# Patient Record
Sex: Male | Born: 1985
Health system: Southern US, Community
[De-identification: ages and names within clinical notes are randomized; demographics above are authoritative.]

## PROBLEM LIST (undated history)

## (undated) DIAGNOSIS — J45909 Unspecified asthma, uncomplicated: Secondary | ICD-10-CM

## (undated) HISTORY — PX: NOSE SURGERY: SHX723

## (undated) HISTORY — PX: NASAL SINUS SURGERY: SHX719

---

## 2015-03-29 DIAGNOSIS — J45909 Unspecified asthma, uncomplicated: Secondary | ICD-10-CM | POA: Diagnosis not present

## 2015-03-29 DIAGNOSIS — J36 Peritonsillar abscess: Secondary | ICD-10-CM | POA: Diagnosis not present

## 2015-03-31 DIAGNOSIS — J351 Hypertrophy of tonsils: Secondary | ICD-10-CM | POA: Diagnosis not present

## 2015-03-31 DIAGNOSIS — J36 Peritonsillar abscess: Secondary | ICD-10-CM | POA: Diagnosis not present

## 2015-04-07 DIAGNOSIS — J343 Hypertrophy of nasal turbinates: Secondary | ICD-10-CM | POA: Diagnosis not present

## 2015-04-07 DIAGNOSIS — J342 Deviated nasal septum: Secondary | ICD-10-CM | POA: Diagnosis not present

## 2015-04-07 DIAGNOSIS — J36 Peritonsillar abscess: Secondary | ICD-10-CM | POA: Diagnosis not present

## 2015-04-07 DIAGNOSIS — J351 Hypertrophy of tonsils: Secondary | ICD-10-CM | POA: Diagnosis not present

## 2015-04-07 DIAGNOSIS — R0981 Nasal congestion: Secondary | ICD-10-CM | POA: Diagnosis not present

## 2016-01-18 ENCOUNTER — Encounter (HOSPITAL_COMMUNITY): Payer: Self-pay | Admitting: Emergency Medicine

## 2016-01-18 ENCOUNTER — Emergency Department (HOSPITAL_COMMUNITY)
Admission: EM | Admit: 2016-01-18 | Discharge: 2016-01-18 | Disposition: A | Discharge status: Home / Self Care | Payer: Self-pay | Attending: Dermatology | Admitting: Dermatology

## 2016-01-18 DIAGNOSIS — J45909 Unspecified asthma, uncomplicated: Secondary | ICD-10-CM | POA: Insufficient documentation

## 2016-01-18 DIAGNOSIS — Z5321 Procedure and treatment not carried out due to patient leaving prior to being seen by health care provider: Secondary | ICD-10-CM | POA: Insufficient documentation

## 2016-01-18 DIAGNOSIS — M549 Dorsalgia, unspecified: Secondary | ICD-10-CM | POA: Insufficient documentation

## 2016-01-18 DIAGNOSIS — F17200 Nicotine dependence, unspecified, uncomplicated: Secondary | ICD-10-CM | POA: Insufficient documentation

## 2016-01-18 HISTORY — DX: Unspecified asthma, uncomplicated: J45.909

## 2016-01-18 NOTE — ED Triage Notes (Signed)
Pt states "the pinkie toes of both feet hurt, I dont know if its gout or not" Pt no hx of gout. Pt has dead skin around both pinkie toes. No redness or swelling noted. Pt states the joints inside his toes hurts as well.

## 2016-01-18 NOTE — ED Triage Notes (Signed)
Pt states he has bulging disks in his back and wants to be seen for that as well.

## 2016-01-18 NOTE — ED Notes (Signed)
Pt stormed by stating he isn't waiting any more. RN advised pt against leaving without being seen by MD. Pt adamant about leaving.

## 2016-01-18 NOTE — ED Notes (Signed)
Patient comes up to nurse first and states he is leaving, This EMT already explained to patient he was next to go back, tried to stop patient and he put down his BP cuff and continued walking out the door. Will be taken out the system

## 2016-01-21 DIAGNOSIS — M79672 Pain in left foot: Secondary | ICD-10-CM | POA: Diagnosis not present

## 2016-01-21 DIAGNOSIS — M79671 Pain in right foot: Secondary | ICD-10-CM | POA: Diagnosis not present

## 2016-01-21 DIAGNOSIS — B353 Tinea pedis: Secondary | ICD-10-CM | POA: Diagnosis not present

## 2016-01-21 DIAGNOSIS — Z825 Family history of asthma and other chronic lower respiratory diseases: Secondary | ICD-10-CM | POA: Diagnosis not present

## 2016-01-21 DIAGNOSIS — J45909 Unspecified asthma, uncomplicated: Secondary | ICD-10-CM | POA: Diagnosis not present

## 2016-02-02 ENCOUNTER — Encounter (HOSPITAL_COMMUNITY): Payer: Self-pay | Admitting: Emergency Medicine

## 2016-02-02 ENCOUNTER — Emergency Department (HOSPITAL_COMMUNITY)
Admission: EM | Admit: 2016-02-02 | Discharge: 2016-02-02 | Disposition: A | Discharge status: Home / Self Care | Payer: Medicare Other | Attending: Emergency Medicine | Admitting: Emergency Medicine

## 2016-02-02 DIAGNOSIS — B09 Unspecified viral infection characterized by skin and mucous membrane lesions: Secondary | ICD-10-CM | POA: Diagnosis not present

## 2016-02-02 DIAGNOSIS — J45909 Unspecified asthma, uncomplicated: Secondary | ICD-10-CM | POA: Insufficient documentation

## 2016-02-02 DIAGNOSIS — R21 Rash and other nonspecific skin eruption: Secondary | ICD-10-CM | POA: Diagnosis not present

## 2016-02-02 DIAGNOSIS — F17200 Nicotine dependence, unspecified, uncomplicated: Secondary | ICD-10-CM | POA: Insufficient documentation

## 2016-02-02 MED ORDER — FAMOTIDINE 20 MG PO TABS: 20.0000 mg | ORAL_TABLET | Freq: Two times a day (BID) | ORAL | 0 refills | Status: DC

## 2016-02-02 MED ORDER — PREDNISONE 10 MG PO TABS: 20.0000 mg | ORAL_TABLET | Freq: Two times a day (BID) | ORAL | 0 refills | Status: DC

## 2016-02-02 MED ORDER — LORATADINE 10 MG PO TABS: 10.0000 mg | ORAL_TABLET | Freq: Every day | ORAL | 0 refills | Status: DC

## 2016-02-02 NOTE — ED Notes (Signed)
Declined W/C at D/C and was escorted to lobby by RN. 

## 2016-02-02 NOTE — ED Triage Notes (Signed)
C/o rash on abd and legs x 3 days.  Unknown cause.

## 2016-02-02 NOTE — Discharge Instructions (Signed)
Your rash could be allergic or viral. We are treating your itching. Stay cool and follow up with Dr. Margo AyeHall if symptoms persist. Take Benadryl at night if needed.

## 2016-02-02 NOTE — ED Provider Notes (Signed)
[facility]-EMERGENCY DEPT Provider Note   CSN: [id number] Arrival date & time: 02/02/16  1541  By signing my name below, I, Emmanuella Mensah, attest that this documentation has been prepared under the direction and in the presence of Kerrie BuffaloHope Neese, NP. Electronically Signed: Angelene GiovanniEmmanuella Mensah, ED Scribe. 02/02/16. 4:33 PM.   History   Chief Complaint Chief Complaint  Patient presents with  . Rash    HPI Comments: Logan Williams is a 30 y.o. male with a hx of asthma who presents to the Emergency Department complaining of gradually spreading moderately itching rash to his back, abdomen, bilateral upper arms, and bilateral anterior upper leg onset 3 days ago. He notes that the rash began on his back and the rash is worse with heat. He reports associated mild shortness of breath. He denies any known causes but states that he recently finished an oral medication for athlete's foot. He denies any new soaps, detergent, or lotion. No alleviating factors noted. He states that he has tried Benadryl with no relief. He has an allergy to bee venom and shellfish. He states that he works in the [employer]. He denies any fever, wheezing, lip/tongue swelling, sore throat, difficulty breathing, or any other symptoms.   The history is provided by the patient. No language interpreter was used.  Rash   This is a new problem. The current episode started more than 2 days ago. The problem has been gradually worsening. The problem is associated with new medications. There has been no fever. The rash is present on the abdomen, back, right arm, left arm, left upper leg and right upper leg. The pain is moderate. The pain has been constant since onset. Associated symptoms include itching. He has tried antihistamines for the symptoms. The treatment provided no relief. Risk factors include new medications.    Past Medical History:  Diagnosis Date  . Asthma     There are no active problems to display for this  patient.   Past Surgical History:  Procedure Laterality Date  . NOSE SURGERY         Home Medications    Prior to Admission medications   Medication Sig Start Date End Date Taking? Authorizing Provider  famotidine (PEPCID) 20 MG tablet Take 1 tablet (20 mg total) by mouth 2 (two) times daily. 02/02/16   Hope Orlene OchM Neese, NP  loratadine (CLARITIN) 10 MG tablet Take 1 tablet (10 mg total) by mouth daily. 02/02/16   Hope Orlene OchM Neese, NP  predniSONE (DELTASONE) 10 MG tablet Take 2 tablets (20 mg total) by mouth 2 (two) times daily with a meal. 02/02/16   Hope Orlene OchM Neese, NP    Family History No family history on file.  Social History Social History  Substance Use Topics  . Smoking status: Current Some Day Smoker  . Smokeless tobacco: Never Used  . Alcohol use Yes     Allergies   Bee venom and Shellfish allergy   Review of Systems Review of Systems  Constitutional: Negative for fever.  HENT: Negative for sore throat and trouble swallowing.   Respiratory: Negative for cough and wheezing.   Skin: Positive for itching and rash.     Physical Exam Updated Vital Signs BP 126/75 (BP Location: Right Arm)   Pulse 63   Temp 99.2 F (37.3 C) (Oral)   Resp 20   SpO2 100%   Physical Exam  Constitutional: He is oriented to person, place, and time. He appears well-developed and well-nourished. No distress.  HENT:  Head: Normocephalic and atraumatic.  Mouth/Throat: Uvula is midline and oropharynx is clear and moist. No posterior oropharyngeal edema or posterior oropharyngeal erythema.  Eyes: Conjunctivae and EOM are normal.  Neck: Neck supple. No tracheal deviation present.  Cardiovascular: Normal rate and regular rhythm.   Pulmonary/Chest: Effort normal and breath sounds normal. No respiratory distress. He has no wheezes.  Musculoskeletal: Normal range of motion.  Neurological: He is alert and oriented to person, place, and time.  Skin: Skin is warm and dry.  There is a dry rash to  the trunk and upper arms.   Psychiatric: He has a normal mood and affect. His behavior is normal.  Nursing note and vitals reviewed.    ED Treatments / Results  DIAGNOSTIC STUDIES: Oxygen Saturation is 100% on RA, normal by my interpretation.    COORDINATION OF CARE: 4:27 PM- Pt advised of plan for treatment and pt agrees. Pt will receive Claritin, Prednisone, and Pepcid. Advised to use Benadryl at night.    Labs (all labs ordered are listed, but only abnormal results are displayed) Labs Reviewed - No data to display  Radiology No results found.  Procedures Procedures (including critical care time)  Medications Ordered in ED Medications - No data to display   Initial Impression / Assessment and Plan / ED Course  Kerrie BuffaloHope Neese, NP has reviewed the triage vital signs and the nursing notes.   Clinical Course   Rash most likely viral, possible early pityriasis vs other viral rash.   Patient with viral exanthem. Discussed that this is self-limited.  No signs of secondary infection. Discharged with symptomatic treatment. Follow up with PCP in 2-3 days. Return precautions discussed. Pt is safe for discharge at this time.  Final Clinical Impressions(s) / ED Diagnoses   Final diagnoses:  Rash    New Prescriptions Discharge Medication List as of 02/02/2016  4:29 PM    START taking these medications   Details  famotidine (PEPCID) 20 MG tablet Take 1 tablet (20 mg total) by mouth 2 (two) times daily., Starting Wed 02/02/2016, Print    loratadine (CLARITIN) 10 MG tablet Take 1 tablet (10 mg total) by mouth daily., Starting Wed 02/02/2016, Print    predniSONE (DELTASONE) 10 MG tablet Take 2 tablets (20 mg total) by mouth 2 (two) times daily with a meal., Starting Wed 02/02/2016, Print       I personally performed the services described in this documentation, which was scribed in my presence. The recorded information has been reviewed and is accurate.    JonesvilleHope M Neese,  NP 02/03/16 2028    Lyndal Pulleyaniel Knott, MD 02/03/16 [phone]

## 2016-10-02 ENCOUNTER — Encounter (HOSPITAL_COMMUNITY): Payer: Self-pay | Admitting: Emergency Medicine

## 2016-10-02 ENCOUNTER — Emergency Department (HOSPITAL_COMMUNITY)
Admission: EM | Admit: 2016-10-02 | Discharge: 2016-10-02 | Disposition: A | Discharge status: Home / Self Care | Payer: Medicare Other | Attending: Emergency Medicine | Admitting: Emergency Medicine

## 2016-10-02 DIAGNOSIS — J45909 Unspecified asthma, uncomplicated: Secondary | ICD-10-CM | POA: Diagnosis not present

## 2016-10-02 DIAGNOSIS — F17200 Nicotine dependence, unspecified, uncomplicated: Secondary | ICD-10-CM | POA: Diagnosis not present

## 2016-10-02 DIAGNOSIS — H02844 Edema of left upper eyelid: Secondary | ICD-10-CM | POA: Insufficient documentation

## 2016-10-02 DIAGNOSIS — T7840XA Allergy, unspecified, initial encounter: Secondary | ICD-10-CM

## 2016-10-02 MED ORDER — PREDNISONE 20 MG PO TABS
60.0000 mg | ORAL_TABLET | Freq: Once | ORAL | Status: AC
  Administered 2016-10-02: 60 mg via ORAL
  Filled 2016-10-02: qty 3

## 2016-10-02 MED ORDER — POLYMYXIN B-TRIMETHOPRIM 10000-0.1 UNIT/ML-% OP SOLN
1.0000 [drp] | Freq: Once | OPHTHALMIC | Status: AC
  Administered 2016-10-02: 1 [drp] via OPHTHALMIC
  Filled 2016-10-02: qty 10

## 2016-10-02 NOTE — ED Triage Notes (Signed)
Pt reports his left eye began to swell this morning.  He reports his asthma also flared up however his inhaler which corrected the problem.  Also reports that he has some swelling in his gums.

## 2016-10-02 NOTE — Discharge Instructions (Signed)
Do not hesitate to return to the emergency room for any new, worsening or concerning symptoms. ° °Please obtain primary care using resource guide below. Let them know that you were seen in the emergency room and that they will need to obtain records for further outpatient management. ° ° °

## 2016-10-02 NOTE — ED Provider Notes (Signed)
[facility]PT Provider Note   CSN: [id number] Arrival date & time: 10/02/16  2048     History   Chief Complaint Chief Complaint  Patient presents with  . Facial Swelling  . Oral Swelling     HPI   Blood pressure 125/71, pulse 69, temperature 98.7 F (37.1 C), temperature source Oral, resp. rate 17, SpO2 99 %.  Logan Williams is a 31 y.o. male complaining of left eye swelling onset this morning from waking. There was no trauma, no change in vision, there is clear discharge from the eye. He does work [employer]. He states that this happened once before. Patient is an asthmatic, he was feeling short of breath this afternoon took his inhaler and feels much better. He states that sometimes he has swollen gums any wonders if it's related to eye swelling. [place] and does not have any primary care. He denies fever, pain, redness, pain with eye movement.  Past Medical History:  Diagnosis Date  . Asthma     There are no active problems to display for this patient.   Past Surgical History:  Procedure Laterality Date  . NOSE SURGERY         Home Medications    Prior to Admission medications   Medication Sig Start Date End Date Taking? Authorizing Provider  famotidine (PEPCID) 20 MG tablet Take 1 tablet (20 mg total) by mouth 2 (two) times daily. 02/02/16   Janne NapoleonNeese, Hope M, NP  loratadine (CLARITIN) 10 MG tablet Take 1 tablet (10 mg total) by mouth daily. 02/02/16   Janne NapoleonNeese, Hope M, NP  predniSONE (DELTASONE) 10 MG tablet Take 2 tablets (20 mg total) by mouth 2 (two) times daily with a meal. 02/02/16   Janne NapoleonNeese, Hope M, NP    Family History No family history on file.  Social History Social History  Substance Use Topics  . Smoking status: Current Some Day Smoker  . Smokeless tobacco: Never Used  . Alcohol use Yes     Allergies   Bee venom and Shellfish allergy   Review of Systems Review of Systems  A complete review of systems was  obtained and all systems are negative except as noted in the HPI and PMH.   Physical Exam Updated Vital Signs BP 125/71 (BP Location: Left Arm)   Pulse 69   Temp 98.7 F (37.1 C) (Oral)   Resp 17   SpO2 99%   Physical Exam  Constitutional: He is oriented to person, place, and time. He appears well-developed and well-nourished. No distress.  HENT:  Head: Normocephalic and atraumatic.  Mouth/Throat: Oropharynx is clear and moist.  Edematous left upper eyelid with scant clear discharge. No conjunctival injection, no warmth. Extraocular movement is intact without pain or diplopia.  Eyes: Pupils are equal, round, and reactive to light. Conjunctivae and EOM are normal.  Neck: Normal range of motion.  Cardiovascular: Normal rate, regular rhythm and intact distal pulses.   Pulmonary/Chest: Effort normal and breath sounds normal.  Abdominal: Soft. There is no tenderness.  Musculoskeletal: Normal range of motion.  Neurological: He is alert and oriented to person, place, and time.  Skin: He is not diaphoretic.  Psychiatric: He has a normal mood and affect.  Nursing note and vitals reviewed.    ED Treatments / Results  Labs (all labs ordered are listed, but only abnormal results are displayed) Labs Reviewed - No data to display  EKG  EKG Interpretation None  Radiology No results found.  Procedures Procedures (including critical care time)  Medications Ordered in ED Medications  trimethoprim-polymyxin b (POLYTRIM) ophthalmic solution 1 drop (not administered)  predniSONE (DELTASONE) tablet 60 mg (not administered)     Initial Impression / Assessment and Plan / ED Course  I have reviewed the triage vital signs and the nursing notes.  Pertinent labs & imaging results that were available during my care of the patient were reviewed by me and considered in my medical decision making (see chart for details).     Vitals:   10/02/16 2101 10/02/16 2232  BP: 111/66  125/71  Pulse: 70 69  Resp: 18 17  Temp: 98.7 F (37.1 C)   TempSrc: Oral   SpO2: 96% 99%    Medications  trimethoprim-polymyxin b (POLYTRIM) ophthalmic solution 1 drop (not administered)  predniSONE (DELTASONE) tablet 60 mg (not administered)    Logan Williams is 31 y.o. male presenting with Left upper eyelid swelling onset this afternoon. There was no trauma to the eye. Vision is not affected. He is not a contact lens wearer. There is no warmth to suggest infection, no pain with eye movement. This is likely an allergic reaction. I would like to treat him with a burst of prednisone. Patient given ophthalmologic antibiotic drops at his request.   Evaluation does not show pathology that would require ongoing emergent intervention or inpatient treatment. Pt is hemodynamically stable and mentating appropriately. Discussed findings and plan with patient/guardian, who agrees with care plan. All questions answered. Return precautions discussed and outpatient follow up given.      Final Clinical Impressions(s) / ED Diagnoses   Final diagnoses:  Allergic reaction, initial encounter    New Prescriptions New Prescriptions   No medications on file     Kaylyn Lim 10/02/16 2253    Tegeler, Canary Brim, MD 10/03/16 1022

## 2016-10-02 NOTE — ED Notes (Signed)
Patient A/O. No distress at this time.  Please see physicians notes.

## 2017-04-14 ENCOUNTER — Encounter (HOSPITAL_COMMUNITY): Payer: Self-pay | Admitting: Emergency Medicine

## 2017-04-14 ENCOUNTER — Emergency Department (HOSPITAL_COMMUNITY)
Admission: EM | Admit: 2017-04-14 | Discharge: 2017-04-14 | Disposition: A | Discharge status: Home / Self Care | Payer: Medicare Other | Attending: Emergency Medicine | Admitting: Emergency Medicine

## 2017-04-14 ENCOUNTER — Emergency Department (HOSPITAL_COMMUNITY): Payer: Medicare Other

## 2017-04-14 DIAGNOSIS — J45909 Unspecified asthma, uncomplicated: Secondary | ICD-10-CM | POA: Insufficient documentation

## 2017-04-14 DIAGNOSIS — R0789 Other chest pain: Secondary | ICD-10-CM | POA: Diagnosis not present

## 2017-04-14 DIAGNOSIS — F17200 Nicotine dependence, unspecified, uncomplicated: Secondary | ICD-10-CM | POA: Insufficient documentation

## 2017-04-14 DIAGNOSIS — R0602 Shortness of breath: Secondary | ICD-10-CM | POA: Diagnosis not present

## 2017-04-14 DIAGNOSIS — Z79899 Other long term (current) drug therapy: Secondary | ICD-10-CM | POA: Diagnosis not present

## 2017-04-14 DIAGNOSIS — J3489 Other specified disorders of nose and nasal sinuses: Secondary | ICD-10-CM | POA: Insufficient documentation

## 2017-04-14 DIAGNOSIS — Z91013 Allergy to seafood: Secondary | ICD-10-CM | POA: Diagnosis not present

## 2017-04-14 DIAGNOSIS — J111 Influenza due to unidentified influenza virus with other respiratory manifestations: Secondary | ICD-10-CM | POA: Diagnosis not present

## 2017-04-14 DIAGNOSIS — R05 Cough: Secondary | ICD-10-CM

## 2017-04-14 DIAGNOSIS — Z91030 Bee allergy status: Secondary | ICD-10-CM | POA: Insufficient documentation

## 2017-04-14 DIAGNOSIS — R0982 Postnasal drip: Secondary | ICD-10-CM | POA: Diagnosis not present

## 2017-04-14 DIAGNOSIS — R0989 Other specified symptoms and signs involving the circulatory and respiratory systems: Secondary | ICD-10-CM | POA: Insufficient documentation

## 2017-04-14 DIAGNOSIS — J029 Acute pharyngitis, unspecified: Secondary | ICD-10-CM | POA: Diagnosis not present

## 2017-04-14 DIAGNOSIS — R6883 Chills (without fever): Secondary | ICD-10-CM | POA: Insufficient documentation

## 2017-04-14 DIAGNOSIS — R059 Cough, unspecified: Secondary | ICD-10-CM

## 2017-04-14 LAB — CBC
HCT: 41.6 % (ref 39.0–52.0)
HEMOGLOBIN: 14.4 g/dL (ref 13.0–17.0)
MCH: 29.6 pg (ref 26.0–34.0)
MCHC: 34.6 g/dL (ref 30.0–36.0)
MCV: 85.4 fL (ref 78.0–100.0)
Platelets: 220 10*3/uL (ref 150–400)
RBC: 4.87 MIL/uL (ref 4.22–5.81)
RDW: 13.1 % (ref 11.5–15.5)
WBC: 7.5 10*3/uL (ref 4.0–10.5)

## 2017-04-14 LAB — BASIC METABOLIC PANEL
ANION GAP: 12 (ref 5–15)
BUN: 5 mg/dL — ABNORMAL LOW (ref 6–20)
CALCIUM: 9.1 mg/dL (ref 8.9–10.3)
CO2: 22 mmol/L (ref 22–32)
Chloride: 104 mmol/L (ref 101–111)
Creatinine, Ser: 1.07 mg/dL (ref 0.61–1.24)
GFR calc Af Amer: >60 mL/min (ref >60)
GFR calc non Af Amer: >60 mL/min (ref >60)
GLUCOSE: 101 mg/dL — AB (ref 65–99)
POTASSIUM: 3.4 mmol/L — AB (ref 3.5–5.1)
Sodium: 138 mmol/L (ref 135–145)

## 2017-04-14 LAB — TROPONIN I: Troponin I: <0.03 ng/mL (ref <0.03)

## 2017-04-14 LAB — INFLUENZA PANEL BY PCR (TYPE A & B)
INFLAPCR: POSITIVE — AB
Influenza B By PCR: NEGATIVE

## 2017-04-14 MED ORDER — HYDROCOD POLST-CPM POLST ER 10-8 MG/5ML PO SUER
5.0000 mL | Freq: Once | ORAL | Status: AC
  Administered 2017-04-14: 5 mL via ORAL
  Filled 2017-04-14: qty 5

## 2017-04-14 MED ORDER — ALBUTEROL SULFATE HFA 108 (90 BASE) MCG/ACT IN AERS: 1.0000 | INHALATION_SPRAY | Freq: Four times a day (QID) | RESPIRATORY_TRACT | 0 refills | Status: DC | PRN

## 2017-04-14 MED ORDER — IPRATROPIUM-ALBUTEROL 0.5-2.5 (3) MG/3ML IN SOLN
RESPIRATORY_TRACT | Status: AC
  Filled 2017-04-14: qty 3

## 2017-04-14 MED ORDER — IBUPROFEN 400 MG PO TABS
600.0000 mg | ORAL_TABLET | Freq: Once | ORAL | Status: AC
  Administered 2017-04-14: 600 mg via ORAL
  Filled 2017-04-14: qty 1

## 2017-04-14 MED ORDER — IPRATROPIUM-ALBUTEROL 0.5-2.5 (3) MG/3ML IN SOLN
3.0000 mL | Freq: Once | RESPIRATORY_TRACT | Status: AC
  Administered 2017-04-14: 3 mL via RESPIRATORY_TRACT
  Filled 2017-04-14: qty 3

## 2017-04-14 MED ORDER — ACETAMINOPHEN 325 MG PO TABS
650.0000 mg | ORAL_TABLET | Freq: Once | ORAL | Status: AC
  Administered 2017-04-14: 650 mg via ORAL

## 2017-04-14 MED ORDER — BENZONATATE 100 MG PO CAPS: 100.0000 mg | ORAL_CAPSULE | Freq: Three times a day (TID) | ORAL | 0 refills | Status: DC

## 2017-04-14 NOTE — Discharge Instructions (Signed)
Your workup has been reassuring.  Your flu positive.  This is likely causing her symptoms.  Make sure that you are continuing Motrin and Tylenol around-the-clock to help with your fevers.  Take the Tessalon for cough.  Use the albuterol inhaler as needed for wheezing or shortness of breath.  I would also recommend that you take over-the-counter decongestion such as Mucinex.  She is drinking plenty of fluids.  Good hand hygiene.  Follow-up with primary care doctor if not improved return to the ED with any worsening symptoms.

## 2017-04-14 NOTE — ED Provider Notes (Signed)
[facility] Provider Note   CSN: [id number] Arrival date & time: 04/14/17  [id number]     History   Chief Complaint Chief Complaint  Patient presents with  . Chest Pain    HPI Logan Williams is a 32 y.o. male.  HPI 32 year old African-American male past medical history significant for asthma presents to the emergency department today with complaints of cough, chest pain, shortness of breath, sore throat and chest congestion.  Patient states that his symptoms started approximately 2 days ago.  He reports chest pain with cough and a nonproductive cough.  He also reports having a sore throat yesterday with associated chest congestion.  Denies any associated nasal congestion.  The patient does not take any over-the-counter medications for his symptoms.  Nothing makes symptoms better or worse.  Patient is tolerating p.o. fluids appropriately.  He does report intermittent chills but denies any known fevers at home.  Patient has been using his albuterol inhaler without any relief.  Denies any associated wheezing.  Reports occasional tobacco use.  Denies any history of DVT/PE, prolonged immobilization, recent hospitalizations or surgery, unilateral leg swelling or calf tenderness, hemoptysis.  Denies any IV drug use.  Pt denies any fever, chill, ha, vision changes, lightheadedness, dizziness, congestion, neck pain, abd pain, n/v/d, urinary symptoms, change in bowel habits, melena, hematochezia, lower extremity paresthesias.  Past Medical History:  Diagnosis Date  . Asthma     There are no active problems to display for this patient.   Past Surgical History:  Procedure Laterality Date  . NOSE SURGERY         Home Medications    Prior to Admission medications   Medication Sig Start Date End Date Taking? Authorizing Provider  famotidine (PEPCID) 20 MG tablet Take 1 tablet (20 mg total) by mouth 2 (two) times daily. 02/02/16   Janne NapoleonNeese, Hope M, NP    loratadine (CLARITIN) 10 MG tablet Take 1 tablet (10 mg total) by mouth daily. 02/02/16   Janne NapoleonNeese, Hope M, NP  predniSONE (DELTASONE) 10 MG tablet Take 2 tablets (20 mg total) by mouth 2 (two) times daily with a meal. 02/02/16   Janne NapoleonNeese, Hope M, NP    Family History History reviewed. No pertinent family history.  Social History Social History   Tobacco Use  . Smoking status: Current Some Day Smoker  . Smokeless tobacco: Never Used  Substance Use Topics  . Alcohol use: Yes  . Drug use: Yes    Types: Marijuana     Allergies   Bee venom and Shellfish allergy   Review of Systems Review of Systems  Constitutional: Positive for chills. Negative for diaphoresis and fever.  HENT: Positive for congestion, postnasal drip and sore throat.   Eyes: Negative for visual disturbance.  Respiratory: Positive for cough and shortness of breath. Negative for wheezing.   Cardiovascular: Positive for chest pain. Negative for palpitations and leg swelling.  Gastrointestinal: Negative for abdominal pain, diarrhea, nausea and vomiting.  Genitourinary: Negative for dysuria, flank pain, frequency, hematuria, scrotal swelling, testicular pain and urgency.  Musculoskeletal: Negative for arthralgias and myalgias.  Skin: Negative for rash.  Neurological: Negative for dizziness, syncope, weakness, light-headedness, numbness and headaches.  Psychiatric/Behavioral: Negative for sleep disturbance. The patient is not nervous/anxious.      Physical Exam Updated Vital Signs BP 119/80 (BP Location: Right Arm)   Pulse 73   Temp 100.3 F (37.9 C) (Oral)   Resp 16   Ht 5\' 4"  (1.626 m)  Wt 72.6 kg (160 lb)   SpO2 100%   BMI 27.46 kg/m   Physical Exam  Constitutional: He is oriented to person, place, and time. He appears well-developed and well-nourished.  Non-toxic appearance. No distress.  HENT:  Head: Normocephalic and atraumatic.  Right Ear: Tympanic membrane, external ear and ear canal normal.  Left  Ear: Tympanic membrane, external ear and ear canal normal.  Nose: Mucosal edema and rhinorrhea present.  Mouth/Throat: Uvula is midline, oropharynx is clear and moist and mucous membranes are normal.  Eyes: Conjunctivae are normal. Pupils are equal, round, and reactive to light. Right eye exhibits no discharge. Left eye exhibits no discharge.  Neck: Normal range of motion. Neck supple. No JVD present. No tracheal deviation present.  Cardiovascular: Normal rate, regular rhythm, normal heart sounds and intact distal pulses. Exam reveals no gallop and no friction rub.  No murmur heard. Pulmonary/Chest: Effort normal and breath sounds normal. No stridor. No respiratory distress. He has no wheezes. He has no rales. He exhibits tenderness.  No hypoxia or tachypnea.    Abdominal: Soft. Bowel sounds are normal. He exhibits no distension. There is no tenderness. There is no rebound and no guarding.  Musculoskeletal: Normal range of motion. He exhibits no tenderness.  No lower extremity edema or calf tenderness.  Lymphadenopathy:    He has no cervical adenopathy.  Neurological: He is alert and oriented to person, place, and time.  Skin: Skin is warm and dry. Capillary refill takes less than 2 seconds. No rash noted. He is not diaphoretic.  Psychiatric: His behavior is normal. Judgment and thought content normal.  Nursing note and vitals reviewed.    ED Treatments / Results  Labs (all labs ordered are listed, but only abnormal results are displayed) Labs Reviewed  BASIC METABOLIC PANEL - Abnormal; Notable for the following components:      Result Value   Potassium 3.4 (*)    Glucose, Bld 101 (*)    BUN 5 (*)    All other components within normal limits  CBC  TROPONIN I  INFLUENZA PANEL BY PCR (TYPE A & B)    EKG  EKG Interpretation  Date/Time:  Saturday April 14 2017 06:57:06 EST Ventricular Rate:  92 PR Interval:  126 QRS Duration: 84 QT Interval:  336 QTC Calculation: 415 R  Axis:   76 Text Interpretation:  Sinus rhythm T wave abnormality Artifact Abnormal ekg Confirmed by Gerhard Munch [phone]) on 04/14/2017 7:05:43 AM       Radiology Dg Chest 2 View  Result Date: 04/14/2017 CLINICAL DATA:  Shortness of breath and cough for 2 days EXAM: CHEST  2 VIEW COMPARISON:  None. FINDINGS: The heart size and mediastinal contours are within normal limits. Both lungs are clear. The visualized skeletal structures are unremarkable. IMPRESSION: No active cardiopulmonary disease. Electronically Signed   By: Alcide Clever M.D.   On: 04/14/2017 07:21    Procedures Procedures (including critical care time)  Medications Ordered in ED Medications  acetaminophen (TYLENOL) tablet 650 mg (650 mg Oral Given 04/14/17 0657)  ipratropium-albuterol (DUONEB) 0.5-2.5 (3) MG/3ML nebulizer solution 3 mL (3 mLs Nebulization Given 04/14/17 0834)  ibuprofen (ADVIL,MOTRIN) tablet 600 mg (600 mg Oral Given 04/14/17 0834)  chlorpheniramine-HYDROcodone (TUSSIONEX) 10-8 MG/5ML suspension 5 mL (5 mLs Oral Given 04/14/17 9604)     Initial Impression / Assessment and Plan / ED Course  I have reviewed the triage vital signs and the nursing notes.  Pertinent labs & imaging results that were  available during my care of the patient were reviewed by me and considered in my medical decision making (see chart for details).    Patient presents the ED with complaints of chest pain, shortness of breath, influenza-like symptoms.  Started 2 days ago.  Patient reports associated fevers and chills. Perc negative.   Patient is overall well-appearing and nontoxic.  He is febrile in the ED which has resolved with antipyretics.  Patient is not hypotensive or tachycardic.  No hypoxia or tachypnea is noted.  On exam lungs clear to auscultation bilaterally.  Heart regular rate and rhythm.  No focal abdominal tenderness to palpation.  No lower extremity edema or calf tenderness.   Lab work reveals no leukocytosis.   Electro lites appear reassuring with normal kidney function.  Negative delta troponin.  He is positive for influenza A.  EKG does show some nonspecific T wave abnormalities.  Repeat EKG shows similar changes with no history of prior.  Chest x-ray is unremarkable for any focal infiltrate.  Patient presentation seems atypical for ACS.  Chest pain is worse with cough.  Cough secondary to influenza infection.  No signs of pneumonia on chest x-ray.  Clinical presentation does not seem consistent with PE, dissection, pericarditis, or myocarditis.   DVitals are stable, low-grade fever.  No signs of dehydration, tolerating PO's. Discussed the cost versus benefit of Tamiflu treatment with the patient.  The patient understands that symptoms are greater than the recommended 24-48 hour window of treatment.  Patient will be discharged with instructions to orally hydrate, rest, and use over-the-counter medications such as anti-inflammatories ibuprofen and Aleve for muscle aches and Tylenol for fever.  Patient will also be given a cough suppressant.   Pt is hemodynamically stable, in NAD, & able to ambulate in the ED. Evaluation does not show pathology that would require ongoing emergent intervention or inpatient treatment. I explained the diagnosis to the patient. Pain has been managed & has no complaints prior to dc. Pt is comfortable with above plan and is stable for discharge at this time. All questions were answered prior to disposition. Strict return precautions for f/u to the ED were discussed. Encouraged follow up with PCP.    Final Clinical Impressions(s) / ED Diagnoses   Final diagnoses:  Influenza  Atypical chest pain  Cough    ED Discharge Orders        Ordered    benzonatate (TESSALON) 100 MG capsule  Every 8 hours     04/14/17 1139    albuterol (PROVENTIL HFA;VENTOLIN HFA) 108 (90 Base) MCG/ACT inhaler  Every 6 hours PRN     04/14/17 1139       Wallace Keller 04/14/17 1536      Gerhard Munch, MD 04/15/17 [phone]

## 2017-04-14 NOTE — ED Triage Notes (Signed)
Pt reports shortness of breath, cough and chest pain x2 days. Dry cough

## 2017-04-25 DIAGNOSIS — F315 Bipolar disorder, current episode depressed, severe, with psychotic features: Secondary | ICD-10-CM | POA: Diagnosis not present

## 2017-04-25 DIAGNOSIS — F4310 Post-traumatic stress disorder, unspecified: Secondary | ICD-10-CM | POA: Diagnosis not present

## 2017-05-16 DIAGNOSIS — F315 Bipolar disorder, current episode depressed, severe, with psychotic features: Secondary | ICD-10-CM | POA: Diagnosis not present

## 2017-05-16 DIAGNOSIS — F4310 Post-traumatic stress disorder, unspecified: Secondary | ICD-10-CM | POA: Diagnosis not present

## 2017-05-21 ENCOUNTER — Encounter (HOSPITAL_COMMUNITY): Payer: Self-pay | Admitting: Emergency Medicine

## 2017-05-21 ENCOUNTER — Other Ambulatory Visit: Payer: Self-pay

## 2017-05-21 ENCOUNTER — Ambulatory Visit (HOSPITAL_COMMUNITY)
Admission: EM | Admit: 2017-05-21 | Discharge: 2017-05-21 | Disposition: A | Discharge status: Home / Self Care | Payer: Medicare Other | Attending: Urgent Care | Admitting: Urgent Care

## 2017-05-21 DIAGNOSIS — R0789 Other chest pain: Secondary | ICD-10-CM

## 2017-05-21 DIAGNOSIS — J3489 Other specified disorders of nose and nasal sinuses: Secondary | ICD-10-CM

## 2017-05-21 DIAGNOSIS — R059 Cough, unspecified: Secondary | ICD-10-CM

## 2017-05-21 DIAGNOSIS — R05 Cough: Secondary | ICD-10-CM

## 2017-05-21 DIAGNOSIS — L500 Allergic urticaria: Secondary | ICD-10-CM

## 2017-05-21 DIAGNOSIS — J0110 Acute frontal sinusitis, unspecified: Secondary | ICD-10-CM

## 2017-05-21 MED ORDER — PREDNISONE 20 MG PO TABS: ORAL_TABLET | ORAL | 0 refills | Status: DC

## 2017-05-21 MED ORDER — AMOXICILLIN 500 MG PO CAPS: 500.0000 mg | ORAL_CAPSULE | Freq: Three times a day (TID) | ORAL | 0 refills | Status: DC

## 2017-05-21 NOTE — Discharge Instructions (Signed)
Take Zyrtec 10mg  once daily with Zantac 150mg  twice daily.

## 2017-05-21 NOTE — ED Triage Notes (Signed)
Cough started yesterday, coughing up brown and green phlegm, headache.  Complains of a rash to arms and torso that started last week.  Patient thinks this is a reaction to psych meds

## 2017-05-21 NOTE — ED Provider Notes (Signed)
  MRN: [id number] DOB: April 18, 1985  Subjective:   Logan Williams is a 32 y.o. male presenting for several day history of malaise, frontal headache, stuffy nose, productive cough that elicits chest pain. Also had an allergic reaction to Depakote, had an urticarial rash that is persisting today. Denies fever, n/v, abdominal pain, chest tightness, facial swelling, edema, throat closing. Was diagnosed and treated for the flu 04/14/2017.   Logan Williams is allergic to bee venom and shellfish allergy.  Logan Williams  has a past medical history of Asthma. Also  has a past surgical history that includes Nose surgery and Nasal sinus surgery. His family history includes Hypertension in his mother.   Objective:   Vitals: BP 137/82 (BP Location: Left Arm)   Pulse 73   Temp 98.5 F (36.9 C) (Oral)   Resp 18   SpO2 100%   Physical Exam  Constitutional: He is oriented to person, place, and time. He appears well-developed and well-nourished.  HENT:  TM's intact bilaterally, no effusions or erythema. Nasal turbinates dry, erythematous, nasal passages patent. Frontal sinus tenderness. Oropharynx with mild post-nasal drainage, mucous membranes moist.  Eyes: Right eye exhibits no discharge. Left eye exhibits no discharge.  Neck: Normal range of motion. Neck supple.  Cardiovascular: Normal rate, regular rhythm and intact distal pulses. Exam reveals no gallop and no friction rub.  No murmur heard. Pulmonary/Chest: No respiratory distress. He has no wheezes. He has no rales.  Abdominal: Soft. Bowel sounds are normal. He exhibits no distension and no mass. There is no tenderness. There is no guarding.  Lymphadenopathy:    He has no cervical adenopathy.  Neurological: He is alert and oriented to person, place, and time.  Skin: Skin is warm and dry. Rash (patches of urticaria over torso, back, arms bilaterally) noted.   Assessment and Plan :   Allergic urticaria  Cough  Atypical chest pain  Frontal sinus pain  Acute  non-recurrent frontal sinusitis  Will start prednisone for rash. Use antihistamines as well. Start amoxicillin for sinusitis. Supportive care recommended otherwise. Return-to-clinic precautions discussed, patient verbalized understanding.    Wallis BambergMani, Jadie Allington, [place]C 05/21/17 1850

## 2017-06-01 DIAGNOSIS — F315 Bipolar disorder, current episode depressed, severe, with psychotic features: Secondary | ICD-10-CM | POA: Diagnosis not present

## 2017-06-01 DIAGNOSIS — F4310 Post-traumatic stress disorder, unspecified: Secondary | ICD-10-CM | POA: Diagnosis not present

## 2017-06-16 ENCOUNTER — Emergency Department (HOSPITAL_COMMUNITY): Payer: Medicare Other

## 2017-06-16 ENCOUNTER — Emergency Department (HOSPITAL_COMMUNITY)
Admission: EM | Admit: 2017-06-16 | Discharge: 2017-06-16 | Disposition: A | Discharge status: Home / Self Care | Payer: Medicare Other | Attending: Emergency Medicine | Admitting: Emergency Medicine

## 2017-06-16 ENCOUNTER — Encounter (HOSPITAL_COMMUNITY): Payer: Self-pay

## 2017-06-16 DIAGNOSIS — M25512 Pain in left shoulder: Secondary | ICD-10-CM | POA: Diagnosis not present

## 2017-06-16 DIAGNOSIS — F17200 Nicotine dependence, unspecified, uncomplicated: Secondary | ICD-10-CM | POA: Insufficient documentation

## 2017-06-16 DIAGNOSIS — Z23 Encounter for immunization: Secondary | ICD-10-CM | POA: Diagnosis not present

## 2017-06-16 DIAGNOSIS — J45909 Unspecified asthma, uncomplicated: Secondary | ICD-10-CM | POA: Diagnosis not present

## 2017-06-16 MED ORDER — TETANUS-DIPHTH-ACELL PERTUSSIS 5-2.5-18.5 LF-MCG/0.5 IM SUSP
0.5000 mL | Freq: Once | INTRAMUSCULAR | Status: AC
  Administered 2017-06-16: 0.5 mL via INTRAMUSCULAR
  Filled 2017-06-16: qty 0.5
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
## 2017-06-16 NOTE — ED Provider Notes (Signed)
.    No other injury.  Complains of left shoulder pain since he forcibly lifted a trash can 8 days ago.  Complains of numbness in index finger and middle finger of left hand since lifting trash can.  No other injury. On exam no distress left upper extremity with full range motion of shoulder and hand.  All fingers with good capillary refill.  Motor strength 5/5 of hand shoulder elbow and wrist.  I did advise patient is a loose body in his left shoulder, and need for orthopedic follow-up   Doug SouJacubowitz, Joey Hudock, MD 06/16/17 1530

## 2017-06-16 NOTE — ED Provider Notes (Signed)
[facility] EMERGENCY DEPARTMENT Provider Note   CSN: [id number] Arrival date & time: 06/16/17  1027     History   Chief Complaint No chief complaint on file.   HPI Logan Williams is a 32 y.o. 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  He states he has pain diffusely over left shoulder with radiation down left arm and tingling sensation.  He denies previous injury to the shoulder.  Has been taking ibuprofen and aspirin for symptoms.  Pain is worse with movement, mostly lifting arm. Does report a dog bite that which he received prior to the injury to the left hand, which is healed.  He states he is unsure if his tetanus was done within 5 years.  States he treated his wound with wound care and topical antibiotic ointment, no other associated complaints.  The history is provided by the patient.    Past Medical History:  Diagnosis Date  . Asthma     There are no active problems to display for this patient.   Past Surgical History:  Procedure Laterality Date  . NASAL SINUS SURGERY    . NOSE SURGERY          Home Medications    Prior to Admission medications   Medication Sig Start Date End Date Taking? Authorizing Provider  albuterol (PROVENTIL HFA;VENTOLIN HFA) 108 (90 Base) MCG/ACT inhaler Inhale 1-2 puffs into the lungs every 6 (six) hours as needed for wheezing or shortness of breath. 04/14/17   Leaphart, Lynann Beaver, PA-C  amoxicillin (AMOXIL) 500 MG capsule Take 1 capsule (500 mg total) by mouth 3 (three) times daily. 05/21/17   Wallis Bamberg, PA-C  predniSONE (DELTASONE) 20 MG tablet Take 2 tablets daily with breakfast. 05/21/17   Wallis Bamberg, PA-C    Family History Family History  Problem Relation Age of Onset  . Hypertension Mother     Social History Social History   Tobacco Use  . Smoking status: Current Some Day  Smoker  . Smokeless tobacco: Never Used  Substance Use Topics  . Alcohol use: Yes  . Drug use: Yes    Types: Marijuana     Allergies   Bee venom and Shellfish allergy   Review of Systems Review of Systems  Constitutional: Negative for fever.  Musculoskeletal: Positive for arthralgias and myalgias. Negative for joint swelling.  Skin: Positive for wound. Negative for color change.     Physical Exam Updated Vital Signs BP (!) 147/84 (BP Location: Right Arm)   Pulse 73   Temp 98 F (36.7 C) (Oral)   Resp 20   SpO2 98%   Physical Exam  Constitutional: He appears well-developed and well-nourished. No distress.  HENT:  Head: Normocephalic and atraumatic.  Eyes: Conjunctivae are normal.  Cardiovascular: Normal rate and intact distal pulses.  Pulmonary/Chest: Effort normal.  Musculoskeletal:  Left shoulder with generalized tenderness surrounding joint, no obvious deformities, edema, erythema or warmth.  Pain with passive abduction.  Pain with resistive internal rotation.  Normal sensation bilaterally.  5/5 grip strength bilaterally.  Skin:  Dorsum of left hand with healed 2 cm scab.  Wound is completely sealed, no tenderness, erythema or drainage. nontender.  Psychiatric: He has a normal mood and affect. His behavior is normal.  Nursing note and vitals reviewed.    ED Treatments / Results  Labs (all  labs ordered are listed, but only abnormal results are displayed) Labs Reviewed - No data to display  EKG None  Radiology Dg Shoulder Left  Result Date: 06/16/2017 CLINICAL DATA:  Patient complains of left shoulder pain with radiation to forearm with any ROM x 1 week. Denies trauma EXAM: LEFT SHOULDER - 2+ VIEW COMPARISON:  None. FINDINGS: No acute fracture or dislocation. Joint spaces maintained. Visualized portion of the left hemithorax is normal. Suspect a 9 mm glenohumeral joint intra-articular loose body projecting inferior to the proximal humerus. IMPRESSION: No acute  osseous abnormality. Suspected intra-articular glenohumeral joint loose body. Electronically Signed   By: Jeronimo GreavesKyle  Talbot M.D.   On: 06/16/2017 14:34    Procedures Procedures (including critical care time)  Medications Ordered in ED Medications  Tdap (BOOSTRIX) injection 0.5 mL (0.5 mLs Intramuscular Given 06/16/17 1429)     Initial Impression / Assessment and Plan / ED Course  I have reviewed the triage vital signs and the nursing notes.  Pertinent labs & imaging results that were available during my care of the patient were reviewed by me and considered in my medical decision making (see chart for details).     Patient presenting with 1 week of left shoulder pain after lifting a heavy bag at work.  Shoulder with generalized tenderness and pain with abduction as well as resistive internal rotation.  X-Ray negative for obvious fracture or dislocation, though showing loose bony body in the [facility].  Shoulder placed in sling for possibility of rotator cuff injury.  Pt advised to follow up with orthopedics. Conservative therapy recommended and discussed. Patient will be dc home & is agreeable with above plan.  Patient discussed with and seen by Dr. Ethelda ChickJacubowitz  Discussed results, findings, treatment and follow up. Patient advised of return precautions. Patient verbalized understanding and agreed with plan.  Final Clinical Impressions(s) / ED Diagnoses   Final diagnoses:  Acute pain of left shoulder    ED Discharge Orders    None       Robinson, [place] N, PA-C 06/16/17 1538    Doug SouJacubowitz, Sam, MD 06/16/17 [id number]

## 2017-06-16 NOTE — Discharge Instructions (Addendum)
Please read instructions below. Apply ice to your shoulder for 20 minutes at a time. You can take ibuprofen every 6 hours as needed for pain. Schedule an appointment with the orthopedic specialist to follow-up on your injury. Return to the ER for new or concerning symptoms.

## 2017-12-10 ENCOUNTER — Ambulatory Visit (HOSPITAL_COMMUNITY)
Admission: EM | Admit: 2017-12-10 | Discharge: 2017-12-10 | Disposition: A | Discharge status: Home / Self Care | Payer: Medicare Other | Attending: Urgent Care | Admitting: Urgent Care

## 2017-12-10 ENCOUNTER — Encounter (HOSPITAL_COMMUNITY): Payer: Self-pay | Admitting: Emergency Medicine

## 2017-12-10 DIAGNOSIS — J4540 Moderate persistent asthma, uncomplicated: Secondary | ICD-10-CM

## 2017-12-10 DIAGNOSIS — R0789 Other chest pain: Secondary | ICD-10-CM | POA: Diagnosis not present

## 2017-12-10 DIAGNOSIS — R05 Cough: Secondary | ICD-10-CM | POA: Diagnosis not present

## 2017-12-10 DIAGNOSIS — R059 Cough, unspecified: Secondary | ICD-10-CM

## 2017-12-10 DIAGNOSIS — Z79899 Other long term (current) drug therapy: Secondary | ICD-10-CM | POA: Insufficient documentation

## 2017-12-10 DIAGNOSIS — M79672 Pain in left foot: Secondary | ICD-10-CM | POA: Insufficient documentation

## 2017-12-10 DIAGNOSIS — M79675 Pain in left toe(s): Secondary | ICD-10-CM | POA: Diagnosis not present

## 2017-12-10 LAB — URIC ACID: URIC ACID, SERUM: 5.7 mg/dL (ref 3.7–8.6)

## 2017-12-10 MED ORDER — IPRATROPIUM-ALBUTEROL 0.5-2.5 (3) MG/3ML IN SOLN
3.0000 mL | Freq: Once | RESPIRATORY_TRACT | Status: AC
  Administered 2017-12-10: 3 mL via RESPIRATORY_TRACT

## 2017-12-10 MED ORDER — METHYLPREDNISOLONE SODIUM SUCC 125 MG IJ SOLR
80.0000 mg | Freq: Once | INTRAMUSCULAR | Status: AC
  Administered 2017-12-10: 80 mg via INTRAMUSCULAR

## 2017-12-10 MED ORDER — ALBUTEROL SULFATE HFA 108 (90 BASE) MCG/ACT IN AERS: 1.0000 | INHALATION_SPRAY | Freq: Four times a day (QID) | RESPIRATORY_TRACT | 0 refills | Status: DC | PRN

## 2017-12-10 MED ORDER — IPRATROPIUM-ALBUTEROL 0.5-2.5 (3) MG/3ML IN SOLN
RESPIRATORY_TRACT | Status: AC
  Filled 2017-12-10: qty 3

## 2017-12-10 MED ORDER — METHYLPREDNISOLONE SODIUM SUCC 125 MG IJ SOLR
INTRAMUSCULAR | Status: AC
  Filled 2017-12-10: qty 2

## 2017-12-10 NOTE — ED Provider Notes (Signed)
  MRN: [id number] DOB: Jan 13, 1986  Subjective:   Logan Williams is a 32 y.o. male presenting for 2 week history of asthma symptoms and left foot pain.  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  Reports that he gets chest tightness, short of breath, wheezing.  Denies fever, chest pain.  Has been using his albuterol inhaler about once per hour.  He does not have a PCP, ran out of his inhaler about a week ago.  He also reports that he has had a couple weeks of worsening left great toe pain.  Reports that sharp, constant.  Denies a history of trauma, falls, gout.  Reports that he does eat meat and cheese but also tries to eat a good balanced diet.   Current Facility-Administered Medications:  .  ipratropium-albuterol (DUONEB) 0.5-2.5 (3) MG/3ML nebulizer solution 3 mL, 3 mL, Nebulization, Once, Logan Williams, Rama Mcclintock, PA-C  Current Outpatient Medications:  .  albuterol (PROVENTIL HFA;VENTOLIN HFA) 108 (90 Base) MCG/ACT inhaler, Inhale 1-2 puffs into the lungs every 6 (six) hours as needed for wheezing or shortness of breath., Disp: 1 Inhaler, Rfl: 0    Allergies  Allergen Reactions  . Bee Venom   . Shellfish Allergy     Past Medical History:  Diagnosis Date  . Asthma      Past Surgical History:  Procedure Laterality Date  . NASAL SINUS SURGERY    . NOSE SURGERY      Objective:   Vitals: BP 111/77 (BP Location: Left Arm)   Pulse 76   Temp 97.9 F (36.6 C) (Oral)   Resp 18   SpO2 95%   Physical Exam  Constitutional: He is oriented to person, place, and time. He appears well-developed and well-nourished.  Cardiovascular: Normal rate, regular rhythm, normal heart sounds and intact distal pulses. Exam reveals no gallop and no friction rub.  No murmur heard. Pulmonary/Chest: No stridor. No respiratory distress. He has wheezes (mild over mid-lower lung fields). He has no rales.  Musculoskeletal:       Left  foot: There is decreased range of motion (slight), tenderness (over area depicted with slight erythema) and swelling (trace). There is no bony tenderness, normal capillary refill, no crepitus, no deformity and no laceration.       Feet:  Neurological: He is alert and oriented to person, place, and time.   Assessment and Plan :   Moderate persistent asthma without complication  Chest tightness  Cough  Great toe pain, left  I refilled his albuterol inhaler and counseled on appropriate use.  We provided patient with a DuoNeb treatment in clinic and IM Solu-Medrol.  Counseled patient on the possibility of gout, uric acid level pending.  Given that he is getting a steroid injection, I expect that this will help as an anti-inflammatory.  Also discussed possibility that the nature of his work may be a source of his toe pain as he also describes having intermittent hand pain bilaterally.  Uric acid level pending.  Return to clinic precautions reviewed.   Logan Williams, Logan Williams, [place]-C 12/10/17 1339

## 2017-12-10 NOTE — ED Triage Notes (Signed)
Pt sts left foot pain and asthma sx x 2 weeks

## 2018-01-01 DIAGNOSIS — M25579 Pain in unspecified ankle and joints of unspecified foot: Secondary | ICD-10-CM | POA: Diagnosis not present

## 2018-01-01 DIAGNOSIS — J4520 Mild intermittent asthma, uncomplicated: Secondary | ICD-10-CM | POA: Diagnosis not present

## 2018-01-01 DIAGNOSIS — Z131 Encounter for screening for diabetes mellitus: Secondary | ICD-10-CM | POA: Diagnosis not present

## 2018-01-01 DIAGNOSIS — Z13220 Encounter for screening for lipoid disorders: Secondary | ICD-10-CM | POA: Diagnosis not present

## 2018-01-01 DIAGNOSIS — K219 Gastro-esophageal reflux disease without esophagitis: Secondary | ICD-10-CM | POA: Diagnosis not present

## 2018-01-01 DIAGNOSIS — M5136 Other intervertebral disc degeneration, lumbar region: Secondary | ICD-10-CM | POA: Diagnosis not present

## 2018-01-01 DIAGNOSIS — E669 Obesity, unspecified: Secondary | ICD-10-CM | POA: Diagnosis not present

## 2018-02-05 DIAGNOSIS — J4520 Mild intermittent asthma, uncomplicated: Secondary | ICD-10-CM | POA: Diagnosis not present

## 2018-02-05 DIAGNOSIS — K219 Gastro-esophageal reflux disease without esophagitis: Secondary | ICD-10-CM | POA: Diagnosis not present

## 2018-02-05 DIAGNOSIS — M5136 Other intervertebral disc degeneration, lumbar region: Secondary | ICD-10-CM | POA: Diagnosis not present

## 2018-02-05 DIAGNOSIS — M25579 Pain in unspecified ankle and joints of unspecified foot: Secondary | ICD-10-CM | POA: Diagnosis not present

## 2018-05-14 DIAGNOSIS — M62838 Other muscle spasm: Secondary | ICD-10-CM | POA: Diagnosis not present

## 2018-05-14 DIAGNOSIS — M79605 Pain in left leg: Secondary | ICD-10-CM | POA: Diagnosis not present

## 2018-05-14 DIAGNOSIS — M545 Low back pain: Secondary | ICD-10-CM | POA: Diagnosis not present

## 2018-05-31 DIAGNOSIS — M545 Low back pain: Secondary | ICD-10-CM | POA: Diagnosis not present

## 2018-05-31 DIAGNOSIS — Z79899 Other long term (current) drug therapy: Secondary | ICD-10-CM | POA: Diagnosis not present

## 2018-05-31 DIAGNOSIS — M79605 Pain in left leg: Secondary | ICD-10-CM | POA: Diagnosis not present

## 2018-05-31 DIAGNOSIS — M62838 Other muscle spasm: Secondary | ICD-10-CM | POA: Diagnosis not present

## 2018-06-01 IMAGING — DX DG CHEST 2V
2 series · 2 of 2 positions shown · non-contrast
Comparison: None.

CLINICAL DATA: Shortness of breath and cough for 2 days

EXAM:
CHEST  2 VIEW

[chest pa]
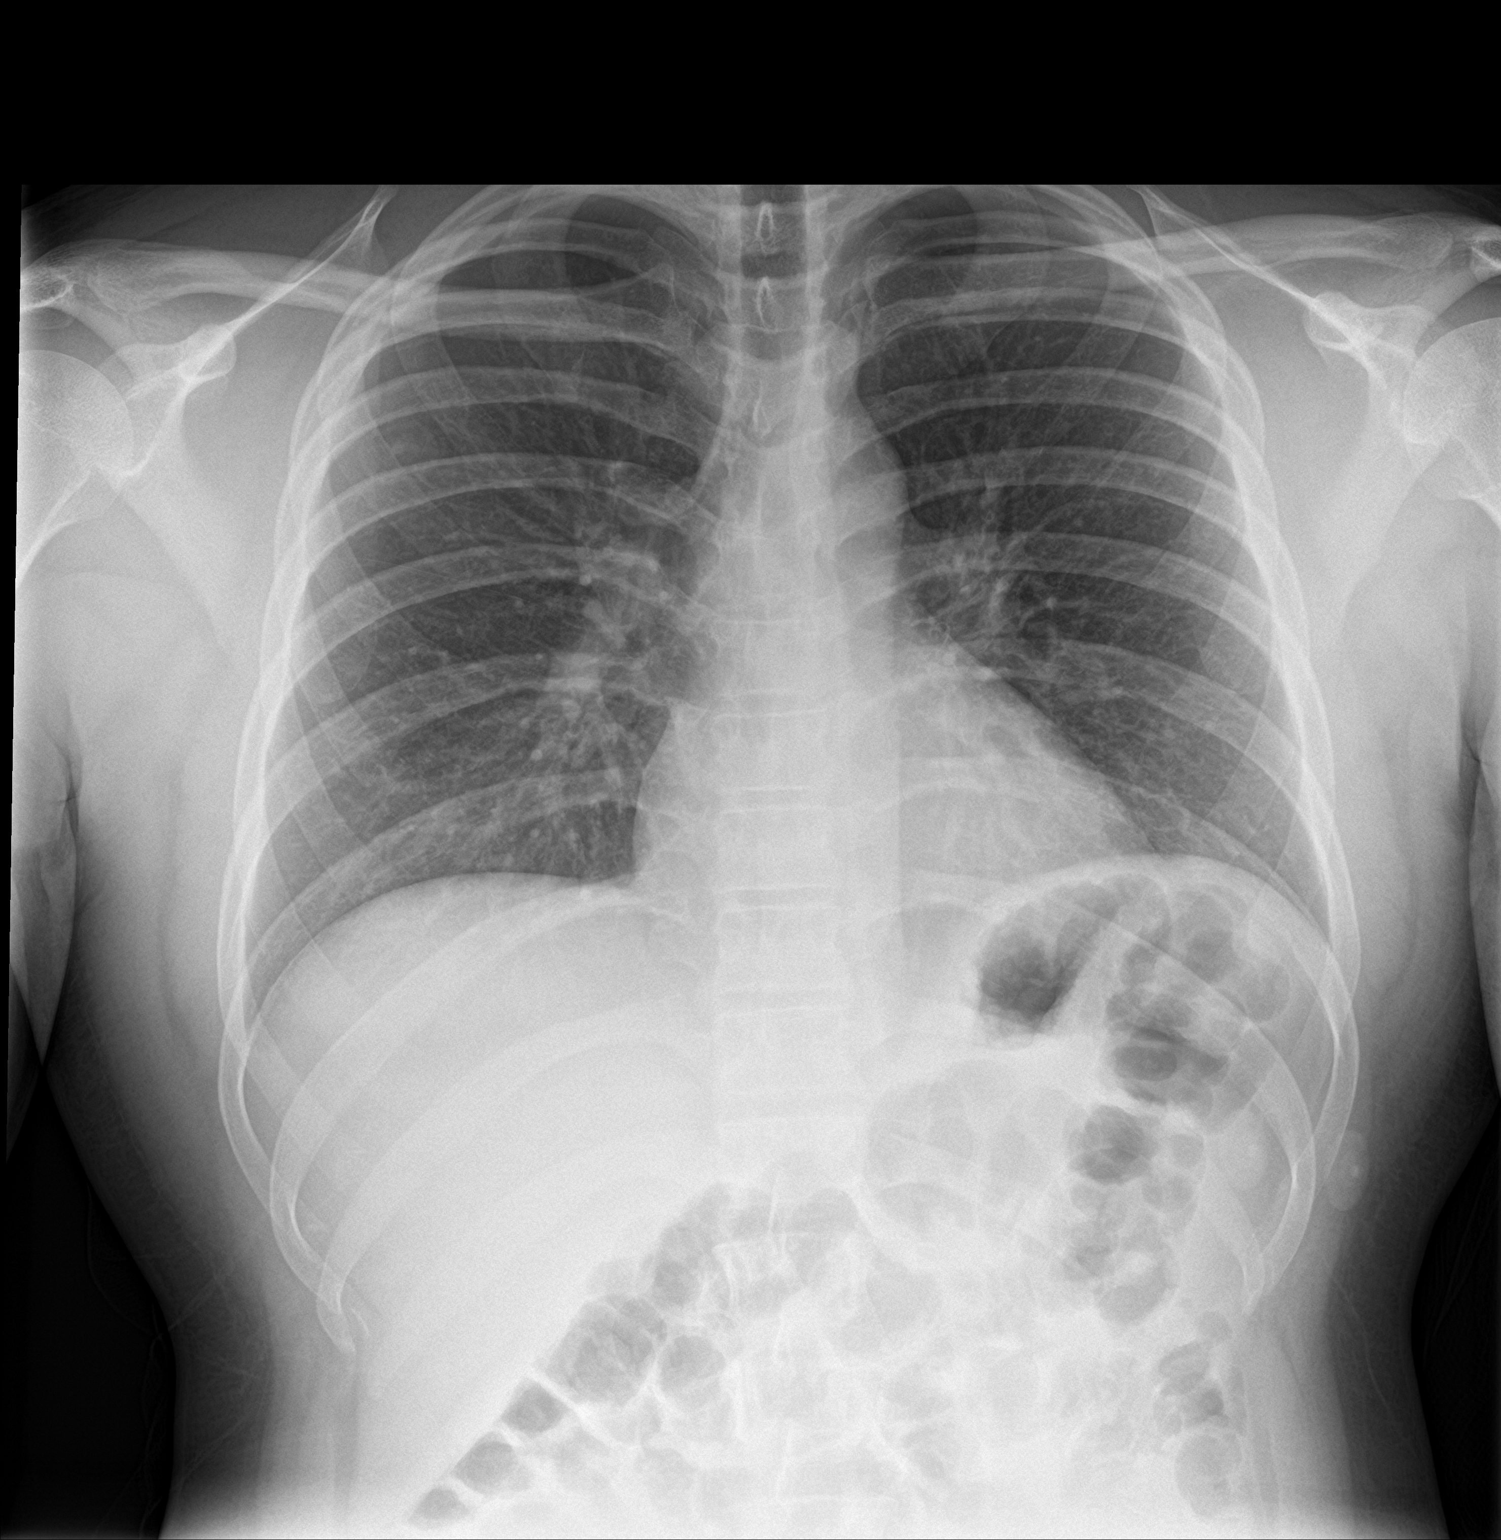

[chest lat]
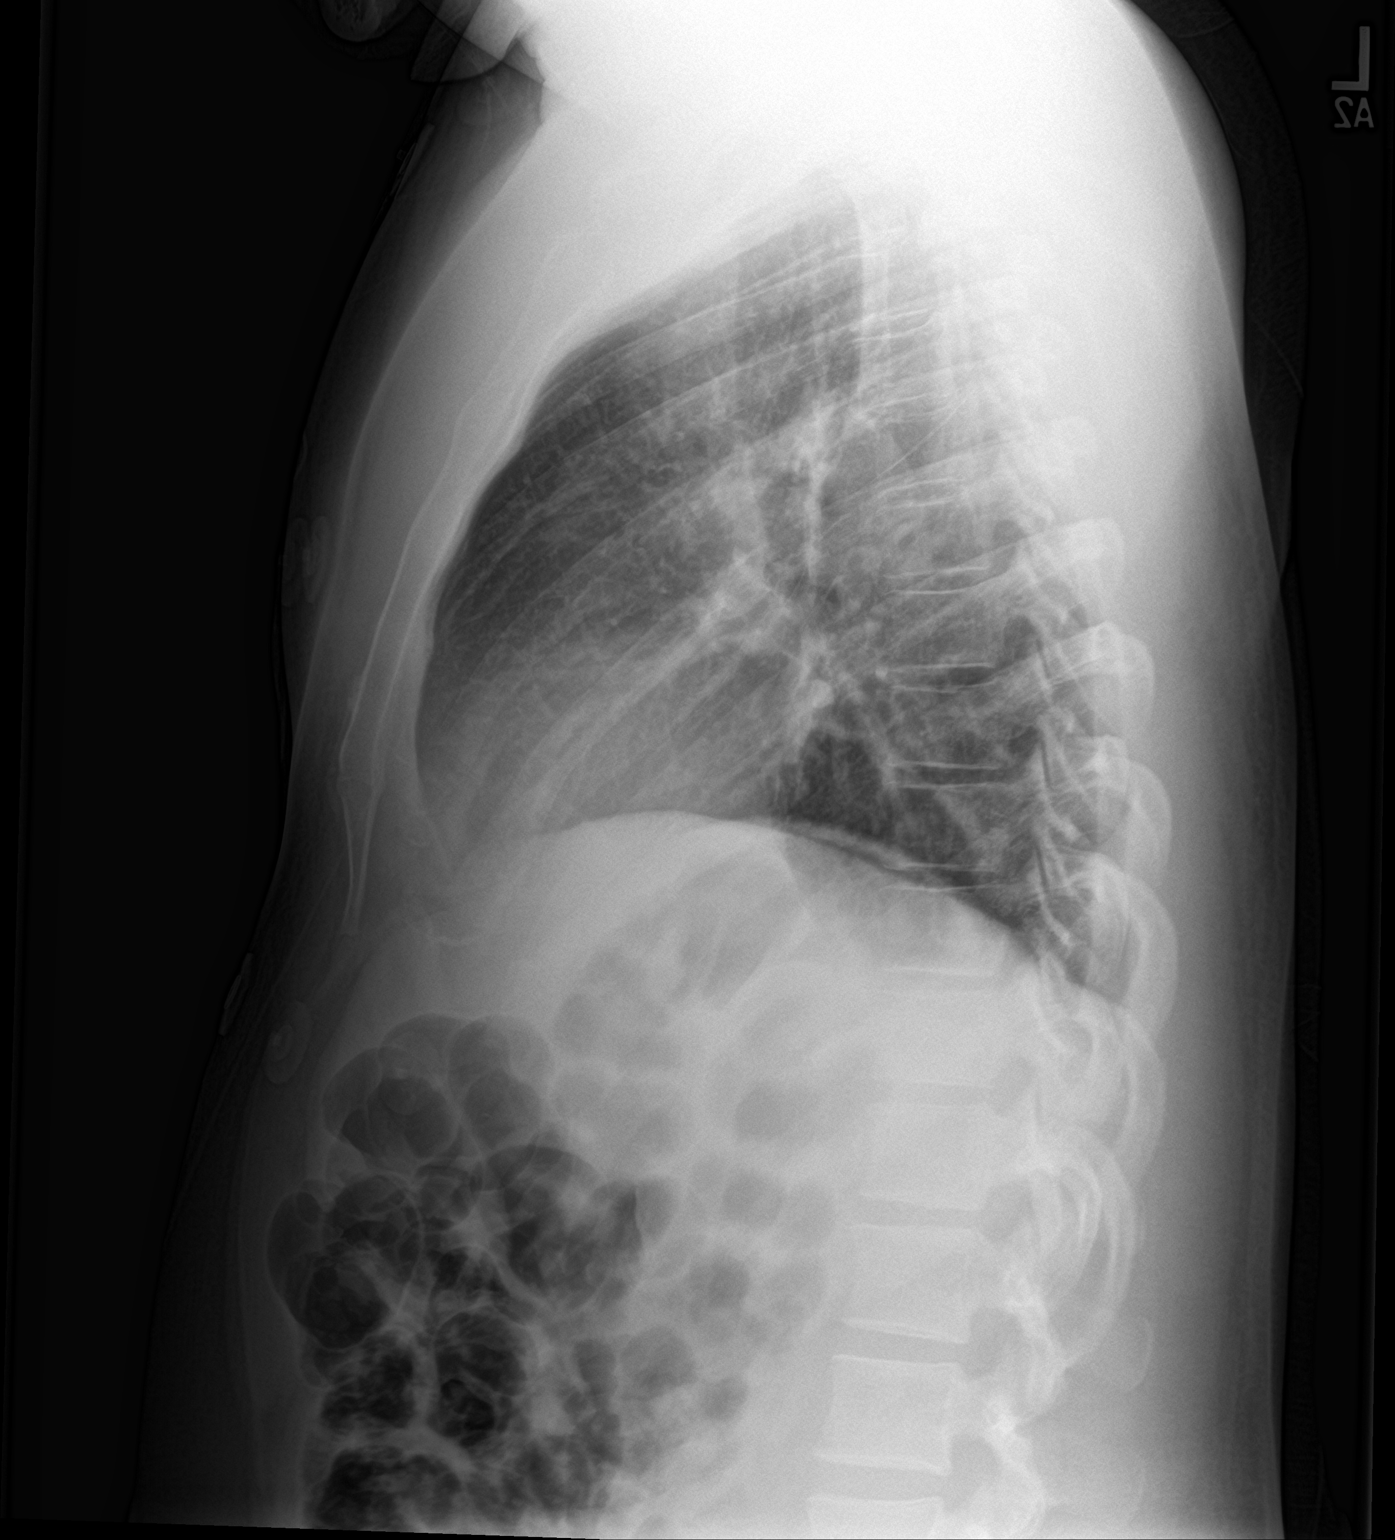

[2 of 2 positions shown; findings below may reference images not displayed]

FINDINGS: The heart size and mediastinal contours are within normal limits.
Both lungs are clear. The visualized skeletal structures are
unremarkable.
IMPRESSION: No active cardiopulmonary disease.

## 2018-06-10 ENCOUNTER — Other Ambulatory Visit: Payer: Self-pay

## 2018-06-10 ENCOUNTER — Ambulatory Visit (HOSPITAL_COMMUNITY)
Admission: EM | Admit: 2018-06-10 | Discharge: 2018-06-10 | Disposition: A | Discharge status: Home / Self Care | Payer: Medicare HMO | Attending: Physician Assistant | Admitting: Physician Assistant

## 2018-06-10 ENCOUNTER — Encounter (HOSPITAL_COMMUNITY): Payer: Self-pay

## 2018-06-10 DIAGNOSIS — R05 Cough: Secondary | ICD-10-CM | POA: Diagnosis not present

## 2018-06-10 DIAGNOSIS — R059 Cough, unspecified: Secondary | ICD-10-CM

## 2018-06-10 MED ORDER — FLUTICASONE PROPIONATE 50 MCG/ACT NA SUSP: 2.0000 | Freq: Every day | NASAL | 0 refills | Status: AC

## 2018-06-10 MED ORDER — BENZONATATE 200 MG PO CAPS: 200.0000 mg | ORAL_CAPSULE | Freq: Three times a day (TID) | ORAL | 0 refills | Status: DC

## 2018-06-10 NOTE — ED Notes (Signed)
Patient verbalizes understanding of discharge instructions. Opportunity for questioning and answers were provided. Patient discharged from [facility] by PA.  
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
## 2018-06-10 NOTE — Discharge Instructions (Addendum)
Tessalon for cough. Start flonase for nasal congestion/drainage. You can use over the counter nasal saline rinse such as neti pot for nasal congestion. Keep hydrated, your urine should be clear to pale yellow in color. Tylenol for fever and pain. Monitor for any worsening of symptoms, chest pain, shortness of breath, wheezing, swelling of the throat, follow up for reevaluation.

## 2018-06-10 NOTE — ED Provider Notes (Signed)
[facility]    CSN: [id number] Arrival date & time: 06/10/18  4098     History   Chief Complaint Chief Complaint  Patient presents with  . Cough    HPI Logan Williams is a 33 y.o. male.   33 year old male with history of asthma comes in for 2-3 day history of productive cough. 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 He states coughing has been stable, but has chest pain with cough and some painful breathing. He felt some shortness of breath/wheezing that is relieved by albuterol inhaler. He denies rhinorrhea, nasal congestion. States had sore throat prior to the cough starting. He denies any fever, though at check in had mentioned fever. He states he had felt chills. He has not taken any medicine for the symptoms. Current some day smoker. No obvious sick contact.      Past Medical History:  Diagnosis Date  . Asthma     There are no active problems to display for this patient.   Past Surgical History:  Procedure Laterality Date  . NASAL SINUS SURGERY    . NOSE SURGERY         Home Medications    Prior to Admission medications   Medication Sig Start Date End Date Taking? Authorizing Provider  albuterol (PROVENTIL HFA;VENTOLIN HFA) 108 (90 Base) MCG/ACT inhaler Inhale 1-2 puffs into the lungs every 6 (six) hours as needed for wheezing or shortness of breath. 12/10/17  Yes Wallis Bamberg, PA-C  benzonatate (TESSALON) 200 MG capsule Take 1 capsule (200 mg total) by mouth every 8 (eight) hours. 06/10/18   Cathie Hoops, Jaretzi Droz V, PA-C  fluticasone (FLONASE) 50 MCG/ACT nasal spray Place 2 sprays into both nostrils daily. 06/10/18   Belinda Fisher, PA-C    Family History Family History  Problem Relation Age of Onset  . Hypertension Mother     Social History Social History   Tobacco Use  . Smoking status: Current Some Day Smoker  . Smokeless tobacco: Never Used  Substance Use Topics  . Alcohol use: Yes  . Drug use: Yes   Types: Marijuana     Allergies   Bee venom and Shellfish allergy   Review of Systems Review of Systems  Reason unable to perform ROS: See HPI as above.     Physical Exam Triage Vital Signs ED Triage Vitals  Enc Vitals Group     BP 06/10/18 0958 (!) 141/82     Pulse Rate 06/10/18 0958 84     Resp 06/10/18 0958 18     Temp 06/10/18 0958 98.3 F (36.8 C)     Temp Source 06/10/18 0958 Oral     SpO2 06/10/18 0958 98 %     Weight 06/10/18 0956 188 lb (85.3 kg)     Height 06/10/18 0956 5\' 4"  (1.626 m)     Head Circumference --      Peak Flow --      Pain Score 06/10/18 0956 0     Pain Loc --      Pain Edu? --      Excl. in GC? --    No data found.  Updated Vital Signs BP (!) 141/82 (BP Location: Left Arm)   Pulse 84   Temp 98.3 F (36.8 C) (Oral)   Resp 18   Ht 5\' 4"  (1.626 m)   Wt 188 lb (85.3 kg)   SpO2 98%   BMI 32.27 kg/m  Physical Exam Constitutional:      General: He is not in acute distress.    Appearance: He is well-developed. He is not ill-appearing, toxic-appearing or diaphoretic.  HENT:     Head: Normocephalic and atraumatic.     Right Ear: Ear canal and external ear normal. Tympanic membrane is not erythematous or bulging.     Left Ear: Ear canal and external ear normal. Tympanic membrane is not erythematous or bulging.     Ears:     Comments: Bilateral TM opaque.     Nose:     Right Sinus: No maxillary sinus tenderness or frontal sinus tenderness.     Left Sinus: Frontal sinus tenderness present. No maxillary sinus tenderness.     Mouth/Throat:     Mouth: Mucous membranes are moist.     Pharynx: Oropharynx is clear. Uvula midline.  Eyes:     Conjunctiva/sclera: Conjunctivae normal.     Pupils: Pupils are equal, round, and reactive to light.  Neck:     Musculoskeletal: Normal range of motion and neck supple.  Cardiovascular:     Rate and Rhythm: Normal rate and regular rhythm.     Heart sounds: Normal heart sounds. No murmur. No friction  rub. No gallop.   Pulmonary:     Effort: Pulmonary effort is normal. No accessory muscle usage, prolonged expiration, respiratory distress or retractions.     Breath sounds: Normal breath sounds. No stridor, decreased air movement or transmitted upper airway sounds. No decreased breath sounds, wheezing, rhonchi or rales.     Comments: Patient speaking in full sentences without difficulty.  Chest:     Chest wall: Tenderness (bilateral) present.  Skin:    General: Skin is warm and dry.  Neurological:     Mental Status: He is alert and oriented to person, place, and time.     UC Treatments / Results  Labs (all labs ordered are listed, but only abnormal results are displayed) Labs Reviewed - No data to display  EKG None  Radiology No results found.  Procedures Procedures (including critical care time)  Medications Ordered in UC Medications - No data to display  Initial Impression / Assessment and Plan / UC Course  I have reviewed the triage vital signs and the nursing notes.  Pertinent labs & imaging results that were available during my care of the patient were reviewed by me and considered in my medical decision making (see chart for details).    No alarming signs on exam. Continue albuterol as directed. Other symptomatic treatment discussed. Discussed would defer from providing NSAIDs/prednisone at this time given possible worsening with COVID patients given we are unable to test for COVID. Return precautions given. Patient expresses understanding and agrees to plan.  Final Clinical Impressions(s) / UC Diagnoses   Final diagnoses:  Cough   ED Prescriptions    Medication Sig Dispense Auth. Provider   fluticasone (FLONASE) 50 MCG/ACT nasal spray Place 2 sprays into both nostrils daily. 1 g Blane Worthington V, PA-C   benzonatate (TESSALON) 200 MG capsule Take 1 capsule (200 mg total) by mouth every 8 (eight) hours. 21 capsule Threasa Alpha, [place] 06/10/18  1025

## 2018-07-18 DIAGNOSIS — M545 Low back pain: Secondary | ICD-10-CM | POA: Diagnosis not present

## 2018-07-18 DIAGNOSIS — Z114 Encounter for screening for human immunodeficiency virus [HIV]: Secondary | ICD-10-CM | POA: Diagnosis not present

## 2018-07-18 DIAGNOSIS — Z0000 Encounter for general adult medical examination without abnormal findings: Secondary | ICD-10-CM | POA: Diagnosis not present

## 2018-07-18 DIAGNOSIS — Z131 Encounter for screening for diabetes mellitus: Secondary | ICD-10-CM | POA: Diagnosis not present

## 2018-07-18 DIAGNOSIS — E559 Vitamin D deficiency, unspecified: Secondary | ICD-10-CM | POA: Diagnosis not present

## 2018-07-18 DIAGNOSIS — Z79899 Other long term (current) drug therapy: Secondary | ICD-10-CM | POA: Diagnosis not present

## 2018-07-18 DIAGNOSIS — E7800 Pure hypercholesterolemia, unspecified: Secondary | ICD-10-CM | POA: Diagnosis not present

## 2018-07-18 DIAGNOSIS — R5383 Other fatigue: Secondary | ICD-10-CM | POA: Diagnosis not present

## 2018-07-18 DIAGNOSIS — K219 Gastro-esophageal reflux disease without esophagitis: Secondary | ICD-10-CM | POA: Diagnosis not present

## 2018-07-25 DIAGNOSIS — B009 Herpesviral infection, unspecified: Secondary | ICD-10-CM | POA: Diagnosis not present

## 2018-07-25 DIAGNOSIS — A530 Latent syphilis, unspecified as early or late: Secondary | ICD-10-CM | POA: Diagnosis not present

## 2018-07-25 DIAGNOSIS — E559 Vitamin D deficiency, unspecified: Secondary | ICD-10-CM | POA: Diagnosis not present

## 2018-07-26 ENCOUNTER — Telehealth: Payer: Self-pay

## 2018-08-06 DIAGNOSIS — M545 Low back pain: Secondary | ICD-10-CM | POA: Diagnosis not present

## 2018-08-06 DIAGNOSIS — A539 Syphilis, unspecified: Secondary | ICD-10-CM | POA: Diagnosis not present

## 2018-08-06 DIAGNOSIS — M79605 Pain in left leg: Secondary | ICD-10-CM | POA: Diagnosis not present

## 2018-08-06 DIAGNOSIS — Z79899 Other long term (current) drug therapy: Secondary | ICD-10-CM | POA: Diagnosis not present

## 2018-08-28 ENCOUNTER — Emergency Department (HOSPITAL_COMMUNITY)
Admission: EM | Admit: 2018-08-28 | Discharge: 2018-08-28 | Disposition: A | Discharge status: Home / Self Care | Payer: Medicare HMO | Attending: Emergency Medicine | Admitting: Emergency Medicine

## 2018-08-28 ENCOUNTER — Other Ambulatory Visit: Payer: Self-pay

## 2018-08-28 ENCOUNTER — Encounter (HOSPITAL_COMMUNITY): Payer: Self-pay | Admitting: Emergency Medicine

## 2018-08-28 DIAGNOSIS — Z79899 Other long term (current) drug therapy: Secondary | ICD-10-CM | POA: Diagnosis not present

## 2018-08-28 DIAGNOSIS — S61451A Open bite of right hand, initial encounter: Secondary | ICD-10-CM | POA: Diagnosis not present

## 2018-08-28 DIAGNOSIS — T148XXA Other injury of unspecified body region, initial encounter: Secondary | ICD-10-CM

## 2018-08-28 DIAGNOSIS — W540XXA Bitten by dog, initial encounter: Secondary | ICD-10-CM | POA: Insufficient documentation

## 2018-08-28 DIAGNOSIS — J45909 Unspecified asthma, uncomplicated: Secondary | ICD-10-CM | POA: Insufficient documentation

## 2018-08-28 DIAGNOSIS — Y929 Unspecified place or not applicable: Secondary | ICD-10-CM | POA: Insufficient documentation

## 2018-08-28 DIAGNOSIS — F17200 Nicotine dependence, unspecified, uncomplicated: Secondary | ICD-10-CM | POA: Insufficient documentation

## 2018-08-28 DIAGNOSIS — Y939 Activity, unspecified: Secondary | ICD-10-CM | POA: Diagnosis not present

## 2018-08-28 DIAGNOSIS — S6991XA Unspecified injury of right wrist, hand and finger(s), initial encounter: Secondary | ICD-10-CM | POA: Diagnosis present

## 2018-08-28 DIAGNOSIS — S61252A Open bite of right middle finger without damage to nail, initial encounter: Secondary | ICD-10-CM | POA: Diagnosis not present

## 2018-08-28 DIAGNOSIS — Y999 Unspecified external cause status: Secondary | ICD-10-CM | POA: Insufficient documentation

## 2018-08-28 MED ORDER — AMOXICILLIN-POT CLAVULANATE 875-125 MG PO TABS
1.0000 | ORAL_TABLET | Freq: Once | ORAL | Status: AC
  Administered 2018-08-28: 1 via ORAL
  Filled 2018-08-28: qty 1

## 2018-08-28 MED ORDER — AMOXICILLIN-POT CLAVULANATE 875-125 MG PO TABS: 1.0000 | ORAL_TABLET | Freq: Two times a day (BID) | ORAL | 0 refills | Status: AC

## 2018-08-28 NOTE — ED Provider Notes (Signed)
[facility] EMERGENCY DEPARTMENT Provider Note   CSN: [id number] Arrival date & time: 08/28/18  0342    History   Chief Complaint Chief Complaint  Patient presents with  . Animal Bite    HPI Logan Williams is a 33 y.o. male.     The history is provided by the patient.  Animal Bite  Contact animal:  Dog Location:  Hand Hand injury location:  R fingers Time since incident:  24 hours Pain details:    Quality:  Aching   Severity:  Moderate   Timing:  Constant   Progression:  Unchanged Animal's rabies vaccination status:  Unknown Animal in possession: yes   Tetanus status:  Up to date Relieved by:  Nothing Worsened by:  Activity Associated symptoms: numbness   Associated symptoms: no fever    Reports he was bit by his puppy yesterday.  The puppy is approximate 7 months, he is unsure if he had rabies vaccine. He was playing with the puppy when it accidentally bit his right middle finger. No other injuries.  Bleeding is controlled.  The patient did wrap his finger very tightly, which triggered worsened pain and some numbness. No fevers or vomiting. Patient is not diabetic Past Medical History:  Diagnosis Date  . Asthma     There are no active problems to display for this patient.   Past Surgical History:  Procedure Laterality Date  . NASAL SINUS SURGERY    . NOSE SURGERY          Home Medications    Prior to Admission medications   Medication Sig Start Date End Date Taking? Authorizing Provider  albuterol (PROVENTIL HFA;VENTOLIN HFA) 108 (90 Base) MCG/ACT inhaler Inhale 1-2 puffs into the lungs every 6 (six) hours as needed for wheezing or shortness of breath. 12/10/17   Wallis BambergMani, Mario, PA-C  amoxicillin-clavulanate (AUGMENTIN) 875-125 MG tablet Take 1 tablet by mouth 2 (two) times daily. One po bid x 7 days 08/28/18   Zadie RhineWickline, Leatta Alewine, MD  fluticasone [facility](FLONASE) 50 MCG/ACT nasal spray Place 2 sprays into both nostrils daily. 06/10/18   Belinda FisherYu, Amy V, PA-C     Family History Family History  Problem Relation Age of Onset  . Hypertension Mother     Social History Social History   Tobacco Use  . Smoking status: Current Some Day Smoker  . Smokeless tobacco: Never Used  Substance Use Topics  . Alcohol use: Yes  . Drug use: Yes    Types: Marijuana     Allergies   Bee venom and Shellfish allergy   Review of Systems Review of Systems  Constitutional: Negative for fever.  Skin: Positive for wound.  Neurological: Positive for numbness.     Physical Exam Updated Vital Signs BP 104/75 (BP Location: Right Arm)   Pulse 88   Temp 98 F (36.7 C) (Oral)   Resp 18   Ht 1.626 m (5\' 4" )   Wt 79.8 kg   SpO2 100%   BMI 30.21 kg/m   Physical Exam CONSTITUTIONAL: Well developed/well nourished HEAD: Normocephalic/atraumatic EYES: EOMI ENMT: Mucous membranes moist NECK: supple no meningeal signs LUNGS:   no apparent distress ABDOMEN: soft  NEURO: Pt is awake/alert/appropriate, moves all extremitiesx4.  No facial droop.   EXTREMITIES: pulses normal/equal, full ROM Full flexion extension noted of right middle finger.  No significant tenderness to the volar aspect of the right middle finger.  No crepitus.  No streaking/erythema noted to hand. Nails intact SKIN: warm, color normal, see photo  PSYCH: no abnormalities of mood noted, alert and oriented to situation      Patient gave verbal permission to utilize photo for medical documentation only The image was not stored on any personal device  ED Treatments / Results  Labs (all labs ordered are listed, but only abnormal results are displayed) Labs Reviewed - No data to display  EKG None  Radiology No results found.  Procedures Procedures    Medications Ordered in ED Medications  amoxicillin-clavulanate (AUGMENTIN) 875-125 MG per tablet 1 tablet (has no administration in time range)     Initial Impression / Assessment and Plan / ED Course  I have reviewed the triage  vital signs and the nursing notes.         Plan to start antibiotics.  Patient reports the dog did not lose teeth, unlikely to have foreign body embedded Patient is unsure if the puppies had rabies vaccine due to age, but very low likelihood of rabies transmission.  Patient reports that the dog has been acting normally.  Will start antibiotics, will need wound care, follow with hand surgery if no improvement.  We discussed return precautions  Final Clinical Impressions(s) / ED Diagnoses   Final diagnoses:  Animal bite  Dog bite of right hand, initial encounter    ED Discharge Orders         Ordered    amoxicillin-clavulanate (AUGMENTIN) 875-125 MG tablet  2 times daily     08/28/18 0408           Ripley Fraise, MD 08/28/18 [phone]

## 2018-08-28 NOTE — ED Triage Notes (Signed)
Pt reports he was bit by his dog this am around 0100, pt reports dog has had rabies vaccine, pt has 2 small lacerations to right middle finger, bleeding controlled

## 2018-10-17 DIAGNOSIS — H00019 Hordeolum externum unspecified eye, unspecified eyelid: Secondary | ICD-10-CM | POA: Diagnosis not present

## 2018-10-17 DIAGNOSIS — Z79899 Other long term (current) drug therapy: Secondary | ICD-10-CM | POA: Diagnosis not present

## 2018-10-17 DIAGNOSIS — M79605 Pain in left leg: Secondary | ICD-10-CM | POA: Diagnosis not present

## 2018-10-17 DIAGNOSIS — M545 Low back pain: Secondary | ICD-10-CM | POA: Diagnosis not present

## 2018-10-17 DIAGNOSIS — R209 Unspecified disturbances of skin sensation: Secondary | ICD-10-CM | POA: Diagnosis not present

## 2018-10-21 NOTE — Telephone Encounter (Signed)
Created in error

## 2018-12-17 ENCOUNTER — Other Ambulatory Visit: Payer: Self-pay | Admitting: Physician Assistant

## 2018-12-17 DIAGNOSIS — IMO0001 Reserved for inherently not codable concepts without codable children: Secondary | ICD-10-CM

## 2018-12-23 ENCOUNTER — Other Ambulatory Visit: Payer: Medicare HMO

## 2019-03-30 ENCOUNTER — Emergency Department (HOSPITAL_COMMUNITY)
Admission: EM | Admit: 2019-03-30 | Discharge: 2019-03-30 | Disposition: A | Discharge status: Home / Self Care | Payer: Medicare HMO | Attending: Emergency Medicine | Admitting: Emergency Medicine

## 2019-03-30 ENCOUNTER — Encounter (HOSPITAL_COMMUNITY): Payer: Self-pay | Admitting: Emergency Medicine

## 2019-03-30 ENCOUNTER — Other Ambulatory Visit: Payer: Self-pay

## 2019-03-30 DIAGNOSIS — Z79899 Other long term (current) drug therapy: Secondary | ICD-10-CM | POA: Diagnosis not present

## 2019-03-30 DIAGNOSIS — Z760 Encounter for issue of repeat prescription: Secondary | ICD-10-CM | POA: Insufficient documentation

## 2019-03-30 DIAGNOSIS — F17210 Nicotine dependence, cigarettes, uncomplicated: Secondary | ICD-10-CM | POA: Insufficient documentation

## 2019-03-30 DIAGNOSIS — J45909 Unspecified asthma, uncomplicated: Secondary | ICD-10-CM | POA: Insufficient documentation

## 2019-03-30 MED ORDER — ALBUTEROL SULFATE HFA 108 (90 BASE) MCG/ACT IN AERS
2.0000 | INHALATION_SPRAY | Freq: Once | RESPIRATORY_TRACT | Status: AC
  Administered 2019-03-30: 2 via RESPIRATORY_TRACT
  Filled 2019-03-30: qty 6.7

## 2019-03-30 MED ORDER — ALBUTEROL SULFATE HFA 108 (90 BASE) MCG/ACT IN AERS: 1.0000 | INHALATION_SPRAY | Freq: Four times a day (QID) | RESPIRATORY_TRACT | 0 refills | Status: AC | PRN

## 2019-03-30 NOTE — Discharge Instructions (Signed)
Use 2 puffs of your inhaler every 4-6 hours, as needed, for wheezing and shortness of breath. Follow up with a primary care doctor.

## 2019-03-30 NOTE — ED Provider Notes (Signed)
[facility] EMERGENCY DEPARTMENT Provider Note   CSN: [id number] Arrival date & time: 03/30/19  1312     History Chief Complaint  Patient presents with  . Medication Refill    Logan Williams is a 34 y.o. male.   34 year old male presents to the emergency department requesting a refill of his inhaler.  He has episodic asthma and last use his inhaler approximately 1 month ago.  Reached for his inhaler last night as he was experiencing wheezing and chest tightness.  The symptoms have since improved.  Feels that his wheezing was aggravated due to cold air exposure.  He has not had any fevers, cough, shortness of breath, syncope, chest pain.  The history is provided by the patient. No language interpreter was used.  Medication Refill      Past Medical History:  Diagnosis Date  . Asthma     There are no problems to display for this patient.   Past Surgical History:  Procedure Laterality Date  . NASAL SINUS SURGERY    . NOSE SURGERY         Family History  Problem Relation Age of Onset  . Hypertension Mother     Social History   Tobacco Use  . Smoking status: Current Some Day Smoker  . Smokeless tobacco: Never Used  Substance Use Topics  . Alcohol use: Yes  . Drug use: Yes    Types: Marijuana    Home Medications Prior to Admission medications   Medication Sig Start Date End Date Taking? Authorizing Provider  albuterol (VENTOLIN HFA) 108 (90 Base) MCG/ACT inhaler Inhale 1-2 puffs into the lungs every 6 (six) hours as needed for wheezing or shortness of breath. 03/30/19   Antony Madura, PA-C  amoxicillin-clavulanate (AUGMENTIN) 875-125 MG tablet Take 1 tablet by mouth 2 (two) times daily. One po bid x 7 days 08/28/18   Zadie Rhine, MD  fluticasone [facility]) 50 MCG/ACT nasal spray Place 2 sprays into both nostrils daily. 06/10/18   Belinda Fisher, PA-C    Allergies    Bee venom and Shellfish allergy  Review of Systems   Review of Systems  Ten systems  reviewed and are negative for acute change, except as noted in the HPI.    Physical Exam Updated Vital Signs BP 119/77 (BP Location: Right Arm)   Pulse 80   Temp 98.3 F (36.8 C) (Oral)   Resp 16   Ht 5\' 4"  (1.626 m)   Wt 78.5 kg   SpO2 100%   BMI 29.70 kg/m   Physical Exam Vitals and nursing note reviewed.  Constitutional:      General: He is not in acute distress.    Appearance: He is well-developed. He is not diaphoretic.     Comments: Nontoxic appearing and in NAD  HENT:     Head: Normocephalic and atraumatic.  Eyes:     General: No scleral icterus.    Conjunctiva/sclera: Conjunctivae normal.  Cardiovascular:     Rate and Rhythm: Normal rate and regular rhythm.     Pulses: Normal pulses.  Pulmonary:     Effort: Pulmonary effort is normal. No respiratory distress.     Breath sounds: No stridor. No wheezing or rales.     Comments: Lungs CTAB. Respirations even and unlabored. Musculoskeletal:        General: Normal range of motion.     Cervical back: Normal range of motion.  Skin:    General: Skin is warm and dry.  Coloration: Skin is not pale.     Findings: No erythema or rash.  Neurological:     General: No focal deficit present.     Mental Status: He is alert and oriented to person, place, and time.     Coordination: Coordination normal.  Psychiatric:        Behavior: Behavior normal.     ED Results / Procedures / Treatments   Labs (all labs ordered are listed, but only abnormal results are displayed) Labs Reviewed - No data to display  EKG None  Radiology No results found.  Procedures Procedures (including critical care time)  Medications Ordered in ED Medications  albuterol (VENTOLIN HFA) 108 (90 Base) MCG/ACT inhaler 2 puff (has no administration in time range)    ED Course  I have reviewed the triage vital signs and the nursing notes.  Pertinent labs & imaging results that were available during my care of the patient were reviewed by  me and considered in my medical decision making (see chart for details).    MDM Rules/Calculators/A&P                       Patient presenting for medication refill as he is out of his inhaler.  He has no complaints of shortness of breath or chest tightness at this time.  Lungs are clear and he is without hypoxia.  Given albuterol inhaler prior to discharge in addition to refill prescription.  He has been advised to follow-up with a primary care doctor.  Patient discharged in stable condition with no unaddressed concerns.   Final Clinical Impression(s) / ED Diagnoses Final diagnoses:  Encounter for medication refill    Rx / DC Orders ED Discharge Orders         Ordered    albuterol (VENTOLIN HFA) 108 (90 Base) MCG/ACT inhaler  Every 6 hours PRN     03/30/19 1356           Antonietta Breach, PA-C 03/30/19 1406    Fredia Sorrow, MD 04/11/19 1513

## 2019-03-30 NOTE — ED Triage Notes (Signed)
Pt states that he is out of his inhaler

## 2020-05-06 ENCOUNTER — Other Ambulatory Visit (HOSPITAL_COMMUNITY): Payer: Self-pay | Admitting: Radiology

## 2020-05-06 DIAGNOSIS — J45909 Unspecified asthma, uncomplicated: Secondary | ICD-10-CM
